# Patient Record
Sex: Female | Born: 1955 | Race: White | Hispanic: No | Marital: Married | Smoking: Never smoker
Health system: Southern US, Community
[De-identification: ages and names within clinical notes are randomized; demographics above are authoritative.]

---

## 2017-08-28 ENCOUNTER — Emergency Department (HOSPITAL_COMMUNITY): Payer: 59

## 2017-08-28 ENCOUNTER — Emergency Department (HOSPITAL_COMMUNITY)
Admission: EM | Admit: 2017-08-28 | Discharge: 2017-08-28 | Disposition: A | Discharge status: Home / Self Care | Payer: 59 | Attending: Emergency Medicine | Admitting: Emergency Medicine

## 2017-08-28 ENCOUNTER — Encounter (HOSPITAL_COMMUNITY): Payer: Self-pay | Admitting: Emergency Medicine

## 2017-08-28 ENCOUNTER — Other Ambulatory Visit: Payer: Self-pay

## 2017-08-28 DIAGNOSIS — Z23 Encounter for immunization: Secondary | ICD-10-CM | POA: Insufficient documentation

## 2017-08-28 DIAGNOSIS — Z7982 Long term (current) use of aspirin: Secondary | ICD-10-CM | POA: Diagnosis not present

## 2017-08-28 DIAGNOSIS — W010XXA Fall on same level from slipping, tripping and stumbling without subsequent striking against object, initial encounter: Secondary | ICD-10-CM | POA: Diagnosis not present

## 2017-08-28 DIAGNOSIS — Y93K1 Activity, walking an animal: Secondary | ICD-10-CM | POA: Insufficient documentation

## 2017-08-28 DIAGNOSIS — S0990XA Unspecified injury of head, initial encounter: Secondary | ICD-10-CM | POA: Diagnosis present

## 2017-08-28 DIAGNOSIS — S8002XA Contusion of left knee, initial encounter: Secondary | ICD-10-CM | POA: Insufficient documentation

## 2017-08-28 DIAGNOSIS — Y999 Unspecified external cause status: Secondary | ICD-10-CM | POA: Insufficient documentation

## 2017-08-28 DIAGNOSIS — S01112A Laceration without foreign body of left eyelid and periocular area, initial encounter: Secondary | ICD-10-CM | POA: Insufficient documentation

## 2017-08-28 DIAGNOSIS — Y929 Unspecified place or not applicable: Secondary | ICD-10-CM | POA: Diagnosis not present

## 2017-08-28 DIAGNOSIS — Z79899 Other long term (current) drug therapy: Secondary | ICD-10-CM | POA: Insufficient documentation

## 2017-08-28 DIAGNOSIS — S0181XA Laceration without foreign body of other part of head, initial encounter: Secondary | ICD-10-CM

## 2017-08-28 MED ORDER — TETANUS-DIPHTH-ACELL PERTUSSIS 5-2.5-18.5 LF-MCG/0.5 IM SUSP
0.5000 mL | Freq: Once | INTRAMUSCULAR | Status: AC
  Administered 2017-08-28: 0.5 mL via INTRAMUSCULAR
  Filled 2017-08-28: qty 0.5

## 2017-08-28 MED ORDER — LIDOCAINE-EPINEPHRINE 2 %-1:200000 IJ SOLN
10.0000 mL | Freq: Once | INTRAMUSCULAR | Status: AC
Start: 2017-08-28 — End: 2017-08-28
  Administered 2017-08-28: 10 mL
  Filled 2017-08-28: qty 20

## 2017-08-28 NOTE — ED Notes (Signed)
PT c/o no LOC

## 2017-08-28 NOTE — Discharge Instructions (Signed)
You can bathe and the stitches can get wet, but just do not scrub or soak with water for prolonged periods of time.  These sutures are dissolvable so within 7 days you should be able to pull them out or they will fall out.

## 2017-08-28 NOTE — ED Notes (Signed)
Post triage pt verbalizes left knee pain; see orders for interventions; DG.

## 2017-08-28 NOTE — ED Provider Notes (Signed)
[facility]-EMERGENCY DEPT Provider Note   CSN: [id number] Arrival date & time: 08/28/17  0850     History   Chief Complaint Chief Complaint  Patient presents with  . Head Injury    HPI Torin Komorowski is a 61 y.o. female.  Patient is a 61 year old female presenting today after mechanical fall.  Patient states she was walking her daughter's dog when it saw a squirrel and took off.  She did not let go of the lesion it pulled her forward causing her to fall hitting her left side of her head on asphalt in her knee on the curb.  She has been able to ambulate since this.  She denies LOC.  She is having a mild headache but denies any blurry vision, nausea or vomiting.  She has had some mild achiness in her left hand but is able to move her shoulders and elbows without difficulty.  She denies any rib pain or shortness of breath.  She did get a cut on her left eyebrow area from the fall.  She denies taking anticoagulation other than an 81 mg aspirin.   The history is provided by the patient.  Head Injury      History reviewed. No pertinent past medical history.  There are no active problems to display for this patient.   History reviewed. No pertinent surgical history.  OB History    No data available       Home Medications    Prior to Admission medications   Medication Sig Start Date End Date Taking? Authorizing Provider  amoxicillin (AMOXIL) 500 MG capsule Take 500 mg 2 (two) times daily by mouth. 08/25/17  Yes [provider]  aspirin 81 MG chewable tablet Chew 81 mg daily by mouth.   Yes [provider]  metoprolol tartrate (LOPRESSOR) 25 MG tablet Take 25 mg 2 (two) times daily by mouth. 05/23/17  Yes [provider]  pravastatin (PRAVACHOL) 40 MG tablet Take 40 mg at bedtime by mouth. 08/15/17  Yes [provider]  ibuprofen (ADVIL,MOTRIN) 600 MG tablet Take 1 tablet every 6 (six) hours as needed by mouth for pain.  08/25/17   [provider]    Family History No family history on file.  Social History Social History   Tobacco Use  . Smoking status: Never Smoker  Substance Use Topics  . Alcohol use: No    Frequency: Never  . Drug use: No     Allergies   Azithromycin   Review of Systems Review of Systems  All other systems reviewed and are negative.    Physical Exam Updated Vital Signs BP (!) 154/76 (BP Location: Left Arm)   Pulse 85   Temp (!) 97.4 F (36.3 C) (Oral)   Resp 14   SpO2 97%   Physical Exam  Constitutional: She is oriented to person, place, and time. She appears well-developed and well-nourished. No distress.  HENT:  Head: Normocephalic. Head is with laceration.    Mouth/Throat: Oropharynx is clear and moist.  Eyes: Conjunctivae and EOM are normal. Pupils are equal, round, and reactive to light.  Neck: Normal range of motion. Neck supple. No spinous process tenderness present. Normal range of motion present.  Cardiovascular: Normal rate, regular rhythm and intact distal pulses.  No murmur heard. Pulmonary/Chest: Effort normal and breath sounds normal. No respiratory distress. She has no wheezes. She has no rales.  Abdominal: Soft. She exhibits no distension. There is no tenderness. There is no rebound  and no guarding.  Musculoskeletal: Normal range of motion. She exhibits tenderness. She exhibits no edema.       Left knee: She exhibits swelling, ecchymosis and bony tenderness. She exhibits normal range of motion, no deformity and no laceration. Tenderness found.       Hands:      Legs: Neurological: She is alert and oriented to person, place, and time.  Skin: Skin is warm and dry. Capillary refill takes less than 2 seconds. No rash noted. No erythema.  Psychiatric: She has a normal mood and affect. Her behavior is normal.  Nursing note and vitals reviewed.    ED Treatments / Results  Labs (all labs ordered are listed, but only abnormal results  are displayed) Labs Reviewed - No data to display  EKG  EKG Interpretation None       Radiology Ct Head Wo Contrast  Result Date: 08/28/2017 CLINICAL DATA:  61 year old female with head injury status post fall while walking dog. Left eyebrow laceration. Denies loss of consciousness. EXAM: CT HEAD WITHOUT CONTRAST TECHNIQUE: Contiguous axial images were obtained from the base of the skull through the vertex without intravenous contrast. COMPARISON:  None. FINDINGS: Brain: Cerebral volume is within normal limits for age. No midline shift, ventriculomegaly, mass effect, evidence of mass lesion, intracranial hemorrhage or evidence of cortically based acute infarction. Gray-white matter differentiation is within normal limits throughout the brain. Vascular: No suspicious intracranial vascular hyperdensity. Skull: No skull fracture identified. Sinuses/Orbits: Clear. Other: Mild left periorbital soft tissue contusion (series 4, image 10). No subcutaneous gas. Underlying left frontal bone appears intact. Visible left globe and intraorbital soft tissues appear normal. Other Visualized orbits and scalp soft tissues are within normal limits. IMPRESSION: 1. Left periorbital superficial soft tissue injury. No underlying fracture identified. 2.  Normal for age non contrast CT appearance of the brain. Electronically Signed   By: Odessa FlemingH  Hall M.D.   On: 08/28/2017 09:53   Dg Knee Complete 4 Views Left  Result Date: 08/28/2017 CLINICAL DATA:  61 year old female status post fall while walking dog. Anterior and lateral left knee pain. Initial encounter. EXAM: LEFT KNEE - COMPLETE 4+ VIEW COMPARISON:  None. FINDINGS: Bone mineralization is within normal limits. No joint effusion. The patella appears intact. Joint spaces and alignment are preserved. No acute osseous abnormality identified. There is superficial soft tissue stranding anterior and slightly superior to the patella. No subcutaneous gas. No radiopaque foreign body  identified. IMPRESSION: 1. Anterior soft tissue injury. 2.  No acute fracture or dislocation identified about the left knee. Electronically Signed   By: Odessa FlemingH  Hall M.D.   On: 08/28/2017 09:54    Procedures Procedures (including critical care time) LACERATION REPAIR Performed by: Gwyneth SproutPLUNKETT,Denia Mcvicar Authorized by: Gwyneth SproutPLUNKETT,Kameron Blethen Consent: Verbal consent obtained. Risks and benefits: risks, benefits and alternatives were discussed Consent given by: patient Patient identity confirmed: provided demographic data Prepped and Draped in normal sterile fashion Wound explored  Laceration Location: left eyebrow  Laceration Length: 2cm  No Foreign Bodies seen or palpated  Anesthesia: local infiltration  Local anesthetic: lidocaine 2% with epinephrine  Anesthetic total: 2 ml  Irrigation method: syringe Amount of cleaning: standard  Skin closure: 6.0 vicryl  Number of sutures: 4  Technique: siimple interrupted  Patient tolerance: Patient tolerated the procedure well with no immediate complications.   Medications Ordered in ED Medications  Tdap (BOOSTRIX) injection 0.5 mL (not administered)  lidocaine-EPINEPHrine (XYLOCAINE W/EPI) 2 %-1:200000 (PF) injection 10 mL (10 mLs Infiltration Given 08/28/17 1200)  Initial Impression / Assessment and Plan / ED Course  I have reviewed the triage vital signs and the nursing notes.  Pertinent labs & imaging results that were available during my care of the patient were reviewed by me and considered in my medical decision making (see chart for details).     Patient with a mechanical fall with injury to the left side of the eyebrow and the left knee.  CT of the head is negative and no sign injury to the knee on x-ray.  Patient is able to ambulate.  Tetanus shot updated.  Wound repaired as above.  Final Clinical Impressions(s) / ED Diagnoses   Final diagnoses:  Facial laceration, initial encounter  Contusion of left knee, initial encounter      ED Discharge Orders    None       Gwyneth SproutPlunkett, Kerrion Kemppainen, MD 08/28/17 1253

## 2017-08-28 NOTE — ED Triage Notes (Signed)
Pt complaint of head injury post getting pulled down walking daughter's dog; laceration noted to left brow; pt denies LOC; takes baby aspirin daily. Event an hour ago.

## 2018-08-06 IMAGING — CR DG KNEE COMPLETE 4+V*L*
4 series · 4 of 4 positions shown · non-contrast
Comparison: None.

CLINICAL DATA: 61-year-old female status post fall while walking
dog. Anterior and lateral left knee pain. Initial encounter.

EXAM:
LEFT KNEE - COMPLETE 4+ VIEW

[t knee ap left]
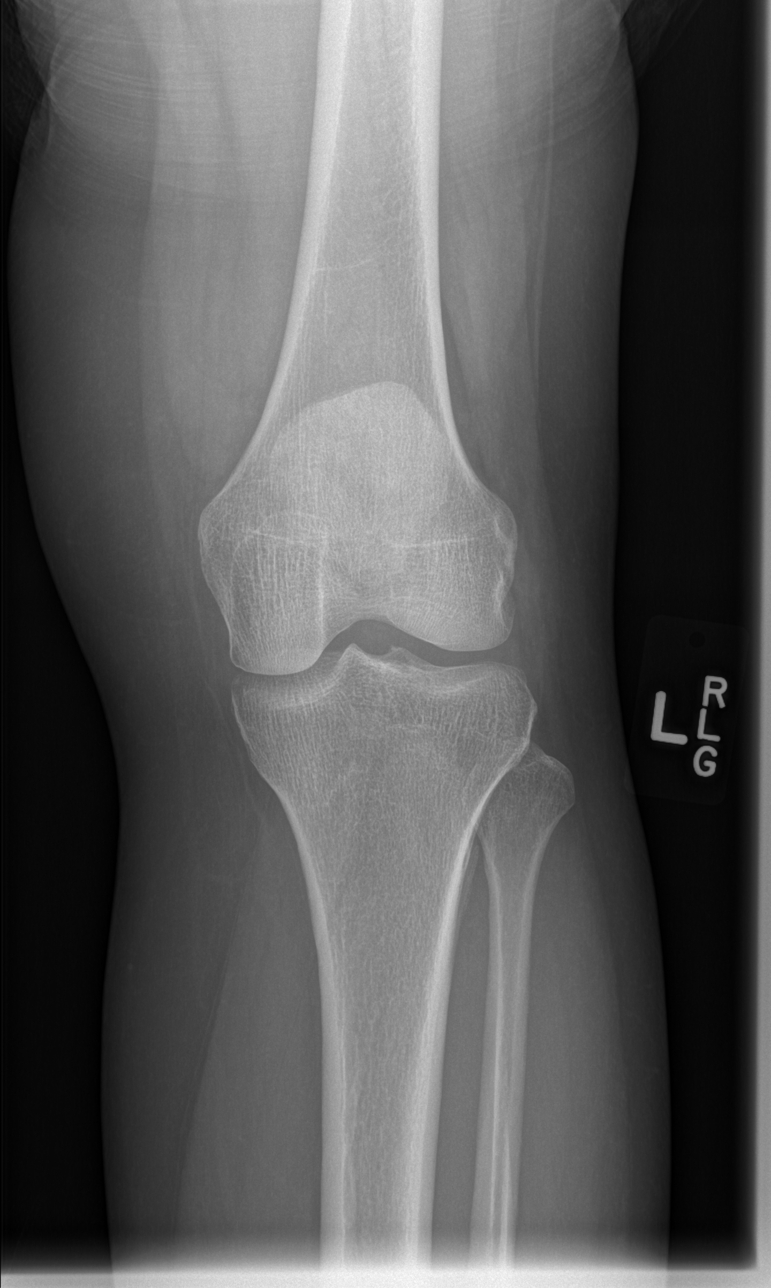

[t knee obl left (1 of 2)]
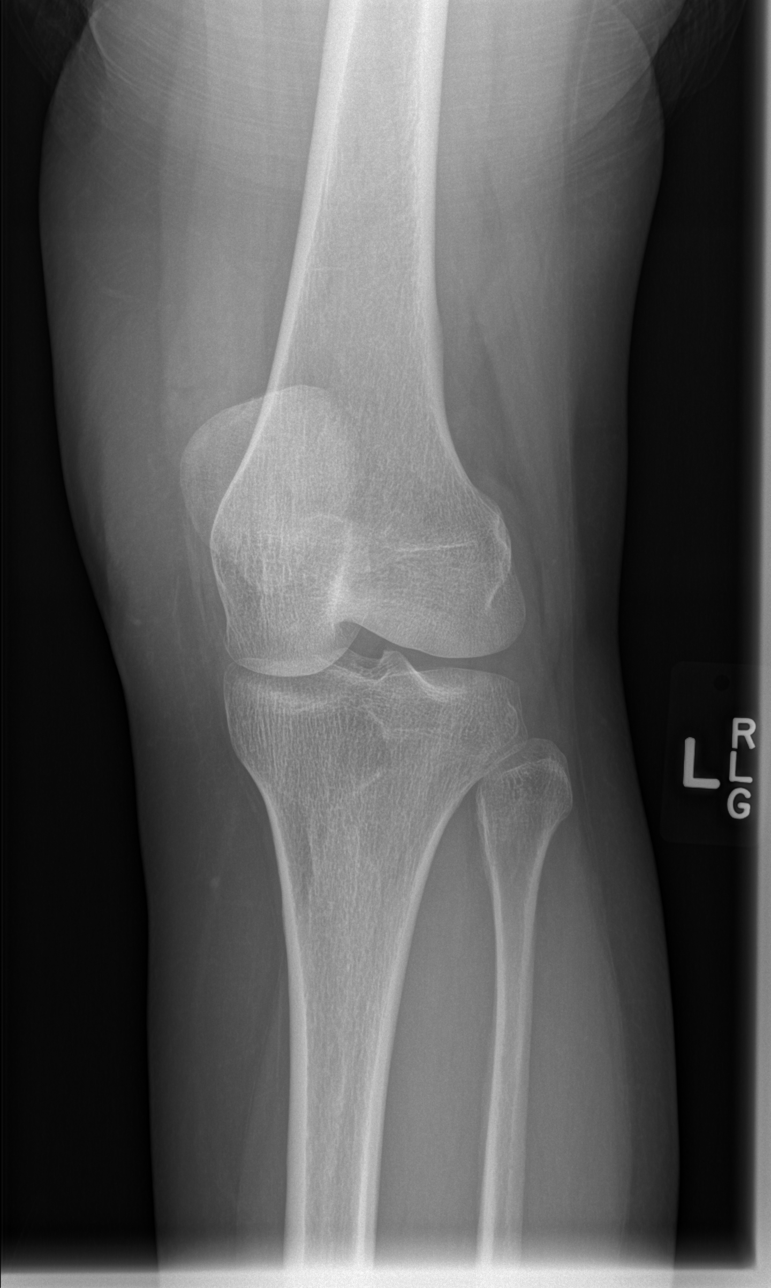

[t knee obl left (2 of 2)]
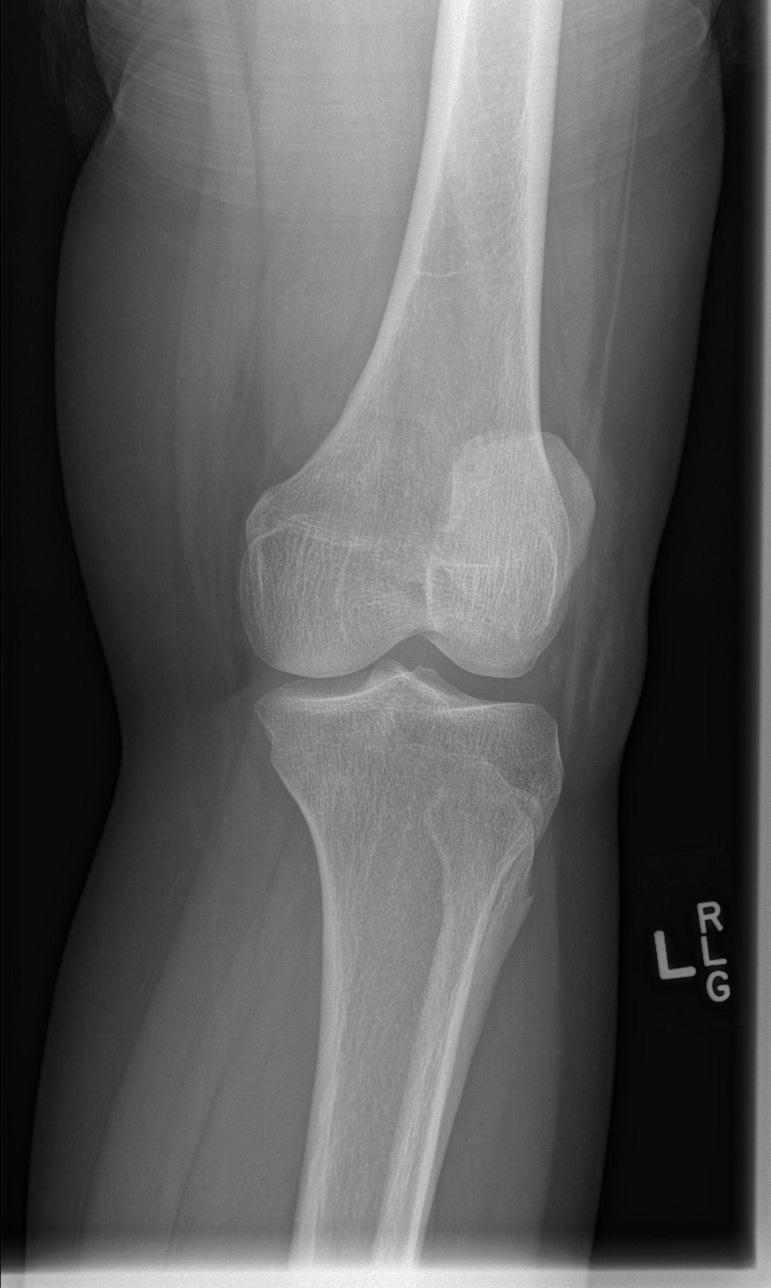

[t knee lat left]
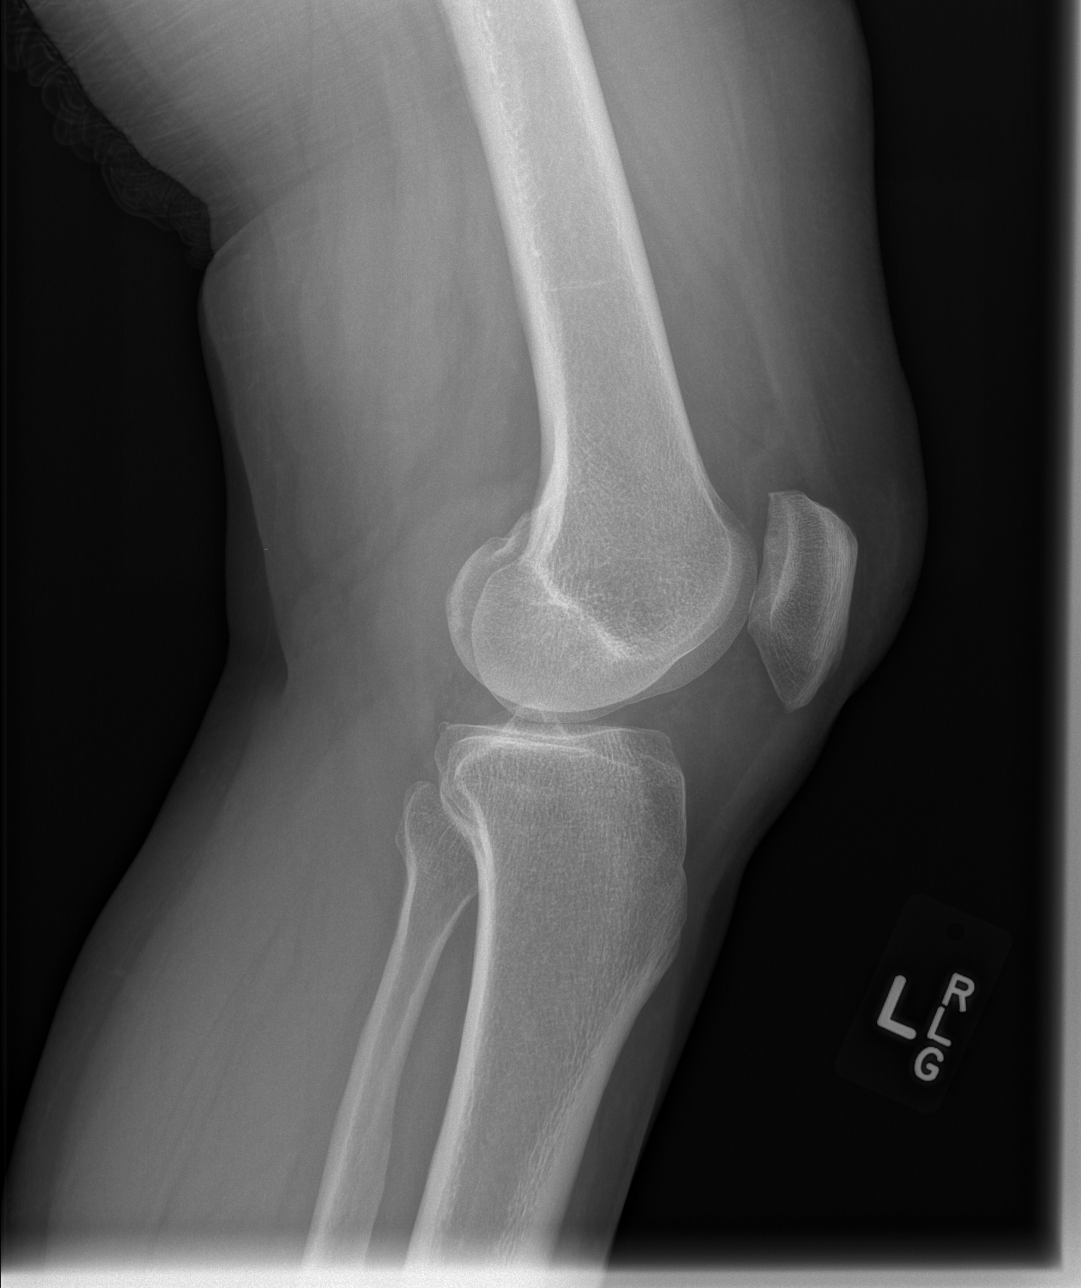

[4 of 4 positions shown; findings below may reference images not displayed]

FINDINGS: Bone mineralization is within normal limits. No joint effusion. The
patella appears intact. Joint spaces and alignment are preserved. No
acute osseous abnormality identified. There is superficial soft
tissue stranding anterior and slightly superior to the patella. No
subcutaneous gas. No radiopaque foreign body identified.
IMPRESSION: 1. Anterior soft tissue injury.
2.  No acute fracture or dislocation identified about the left knee.
